# Patient Record
Sex: Male | Born: 1945 | Race: White | Hispanic: No | Smoking: Former smoker
Health system: Southern US, Community
[De-identification: ages and names within clinical notes are randomized; demographics above are authoritative.]

## PROBLEM LIST (undated history)

## (undated) DIAGNOSIS — I4891 Unspecified atrial fibrillation: Secondary | ICD-10-CM

## (undated) DIAGNOSIS — G4730 Sleep apnea, unspecified: Secondary | ICD-10-CM

## (undated) DIAGNOSIS — E119 Type 2 diabetes mellitus without complications: Secondary | ICD-10-CM

## (undated) DIAGNOSIS — J439 Emphysema, unspecified: Secondary | ICD-10-CM

## (undated) DIAGNOSIS — I10 Essential (primary) hypertension: Secondary | ICD-10-CM

## (undated) HISTORY — DX: Sleep apnea, unspecified: G47.30

## (undated) HISTORY — DX: Emphysema, unspecified: J43.9

## (undated) HISTORY — DX: Unspecified atrial fibrillation: I48.91

## (undated) HISTORY — DX: Essential (primary) hypertension: I10

## (undated) HISTORY — DX: Type 2 diabetes mellitus without complications: E11.9

---

## 1978-09-19 HISTORY — PX: NASAL SINUS SURGERY: SHX719

## 1992-09-19 HISTORY — PX: COLON SURGERY: SHX602

## 2010-10-18 LAB — URINALYSIS, ROUTINE W REFLEX MICROSCOPIC
Bilirubin Urine: NEGATIVE
Nitrite: NEGATIVE
Specific Gravity, Urine: 1.019 (ref 1.005–1.030)
Urobilinogen, UA: 1 mg/dL (ref 0.0–1.0)
pH: 5.5 (ref 5.0–8.0)

## 2010-10-18 LAB — CBC
HCT: 50.2 % (ref 39.0–52.0)
MCV: 92.3 fL (ref 78.0–100.0)
Platelets: 188 10*3/uL (ref 150–400)
RBC: 5.44 MIL/uL (ref 4.22–5.81)
RDW: 13.6 % (ref 11.5–15.5)
WBC: 10.2 10*3/uL (ref 4.0–10.5)

## 2010-10-18 LAB — COMPREHENSIVE METABOLIC PANEL
ALT: 18 U/L (ref 0–53)
AST: 19 U/L (ref 0–37)
Alkaline Phosphatase: 58 U/L (ref 39–117)
CO2: 29 mEq/L (ref 19–32)
Chloride: 103 mEq/L (ref 96–112)
GFR calc Af Amer: >60 mL/min (ref >60)
GFR calc non Af Amer: >60 mL/min (ref >60)
Glucose, Bld: 133 mg/dL — ABNORMAL HIGH (ref 70–99)
Sodium: 144 mEq/L (ref 135–145)
Total Bilirubin: 0.7 mg/dL (ref 0.3–1.2)

## 2010-10-18 LAB — URINE MICROSCOPIC-ADD ON

## 2010-10-18 LAB — SURGICAL PCR SCREEN: Staphylococcus aureus: NEGATIVE

## 2010-10-21 ENCOUNTER — Ambulatory Visit (HOSPITAL_COMMUNITY)
Admission: RE | Admit: 2010-10-21 | Discharge: 2010-10-21 | Disposition: A | Discharge status: Home / Self Care | Payer: Worker's Compensation | Source: Ambulatory Visit | Attending: Orthopedic Surgery | Admitting: Orthopedic Surgery

## 2010-10-21 DIAGNOSIS — M659 Unspecified synovitis and tenosynovitis, unspecified site: Secondary | ICD-10-CM | POA: Insufficient documentation

## 2010-10-21 DIAGNOSIS — Z87828 Personal history of other (healed) physical injury and trauma: Secondary | ICD-10-CM | POA: Insufficient documentation

## 2010-10-21 DIAGNOSIS — M23305 Other meniscus derangements, unspecified medial meniscus, unspecified knee: Secondary | ICD-10-CM | POA: Insufficient documentation

## 2010-10-21 DIAGNOSIS — IMO0002 Reserved for concepts with insufficient information to code with codable children: Secondary | ICD-10-CM | POA: Insufficient documentation

## 2010-10-21 DIAGNOSIS — M23302 Other meniscus derangements, unspecified lateral meniscus, unspecified knee: Secondary | ICD-10-CM | POA: Insufficient documentation

## 2010-10-21 DIAGNOSIS — M2240 Chondromalacia patellae, unspecified knee: Secondary | ICD-10-CM | POA: Insufficient documentation

## 2010-10-21 LAB — GLUCOSE, CAPILLARY

## 2010-11-01 NOTE — Op Note (Signed)
NAME:  Timothy Ruiz, Timothy Ruiz NO.:  [id number]  MEDICAL RECORD NO.:  [id number]           PATIENT TYPE:  O  LOCATION:  [facility]                         FACILITY:  [facility]  PHYSICIAN:  Vania Rea. Jaquelyne Firkus, M.D.  DATE OF BIRTH:  Feb 23, 1946  DATE OF PROCEDURE:  10/21/2010 DATE OF DISCHARGE:                              OPERATIVE REPORT   PREOPERATIVE DIAGNOSIS:  Left knee medial meniscus tear.  POSTOPERATIVE DIAGNOSES: 1. Left knee medial meniscus tear. 2. Left knee lateral meniscus tear. 3. Left knee chondromalacia. 4. Left knee synovitis.  PROCEDURES: 1. Left knee diagnostic arthroscopy. 2. Partial medial and partial lateral meniscectomies. 3. Chondroplasty of the patellofemoral joint. 4. Chondroplasty of the medial and lateral femoral condyles. 5. Extensive synovectomy.  SURGEON:  Vania Rea. Akai Dollard, MD.  ASSISTANT:  A. Shuford, PA-C.  ANESTHESIA:  LMA general as well as local.  TOURNIQUET TIME:  None was used.  BLOOD LOSS:  Minimal.  DRAINS:  None.  HISTORY:  Mr. Fischman is a 65 year old gentleman who sustained a work- related left knee injury, has had persistent medial knee pain and mechanical symptoms refractory to attempts at conservative management. Preoperative MRI scan does show evidence for medial meniscal tear as well as chondromalacia.  Due to his ongoing pain and functional limitations, he is brought to the operating room at this time for planned left knee arthroscopy as described below.  Preoperatively, I counseled Mr. Rastetter on treatment options as well as risks versus benefits thereof.  Possible surgical complications were reviewed including potential for bleeding, infection, neurovascular injury, DVT, PE, as well as persistence of pain.  He understands and accepts and agrees with our planned procedure.  PROCEDURE IN DETAIL:  After undergoing routine preop evaluation, the patient received prophylactic antibiotics, and a knee block  anesthetic was placed in the holding area by the Anesthesia Department.  Placed supine on the operating table, underwent smooth induction of LMA general anesthesia.  Left leg placed in a leg holder and sterilely prepped and draped in standard fashion.  Time-out was called.  Standard arthroscopy portals were established and diagnostic arthroscopy was performed.  The suprapatellar pouch and gutters showed diffuse synovitis, particularly in the anterior chamber, encroaching upon the patellofemoral joint. Extensive synovectomy was performed.  Patella showed grade 2 chondromalacia on the lateral patellar facet and there was grade 3 on the medial trochlear groove, and these areas were debrided with a shaver to a stable chondral base.  Normal patellar tracking.  Intercondylar notch of the ACL to be intact.  Medially, there was broad, grade 3 chondromalacia in the medial femoral condyle, which was debrided to a stable chondral base with a shaver.  There was a complex tear involving the posterior half of the medial meniscus and a number of areas of chondrocalcinosis.  A basket was introduced and used to trim the medial meniscus back to a stable peripheral margin.  The shaver was used for final contouring it and removal of the meniscal fragments.  Probing in the remaining peripheral rim showed it to be stable.  An estimated approximately 30% to 40% meniscus was excised.  Laterally, there was a very small  degenerative tear of the mid to lateral meniscus, which was trimmed back with a shaver, and there was some grade 2 chondromalacia on the lateral femoral condyle, which was debrided with shaver.  At this point, final inspection and irrigation were then completed.  Fluid and instruments were removed.  A combination of Marcaine, morphine, and clonidine was instilled in the knee joint, additional Marcaine about the portals.  Portals were closed with Steri-Strips.  A bulky dry dressing taped at the left  knee and was wrapped with Ace bandage, thigh-high support stocking.  The patient was then awakened, extubated, and taken to recovery room in stable condition.     Vania Rea. Lamiya Naas, M.D.     KMS/MEDQ  D:  10/21/2010  T:  10/22/2010  Job:  [id number]  Electronically Signed by Francena Hanly M.D. on 11/01/2010 12:04:44 PM

## 2012-01-20 IMAGING — CR DG CHEST 2V
2 series · 2 of 2 positions shown · non-contrast
Comparison: None.

CLINICAL DATA: Medial meniscus tear in the left knee.  Preoperative
respiratory evaluation.

CHEST - 2 VIEW 06/19/2011:

[view not recorded (1 of 2)]
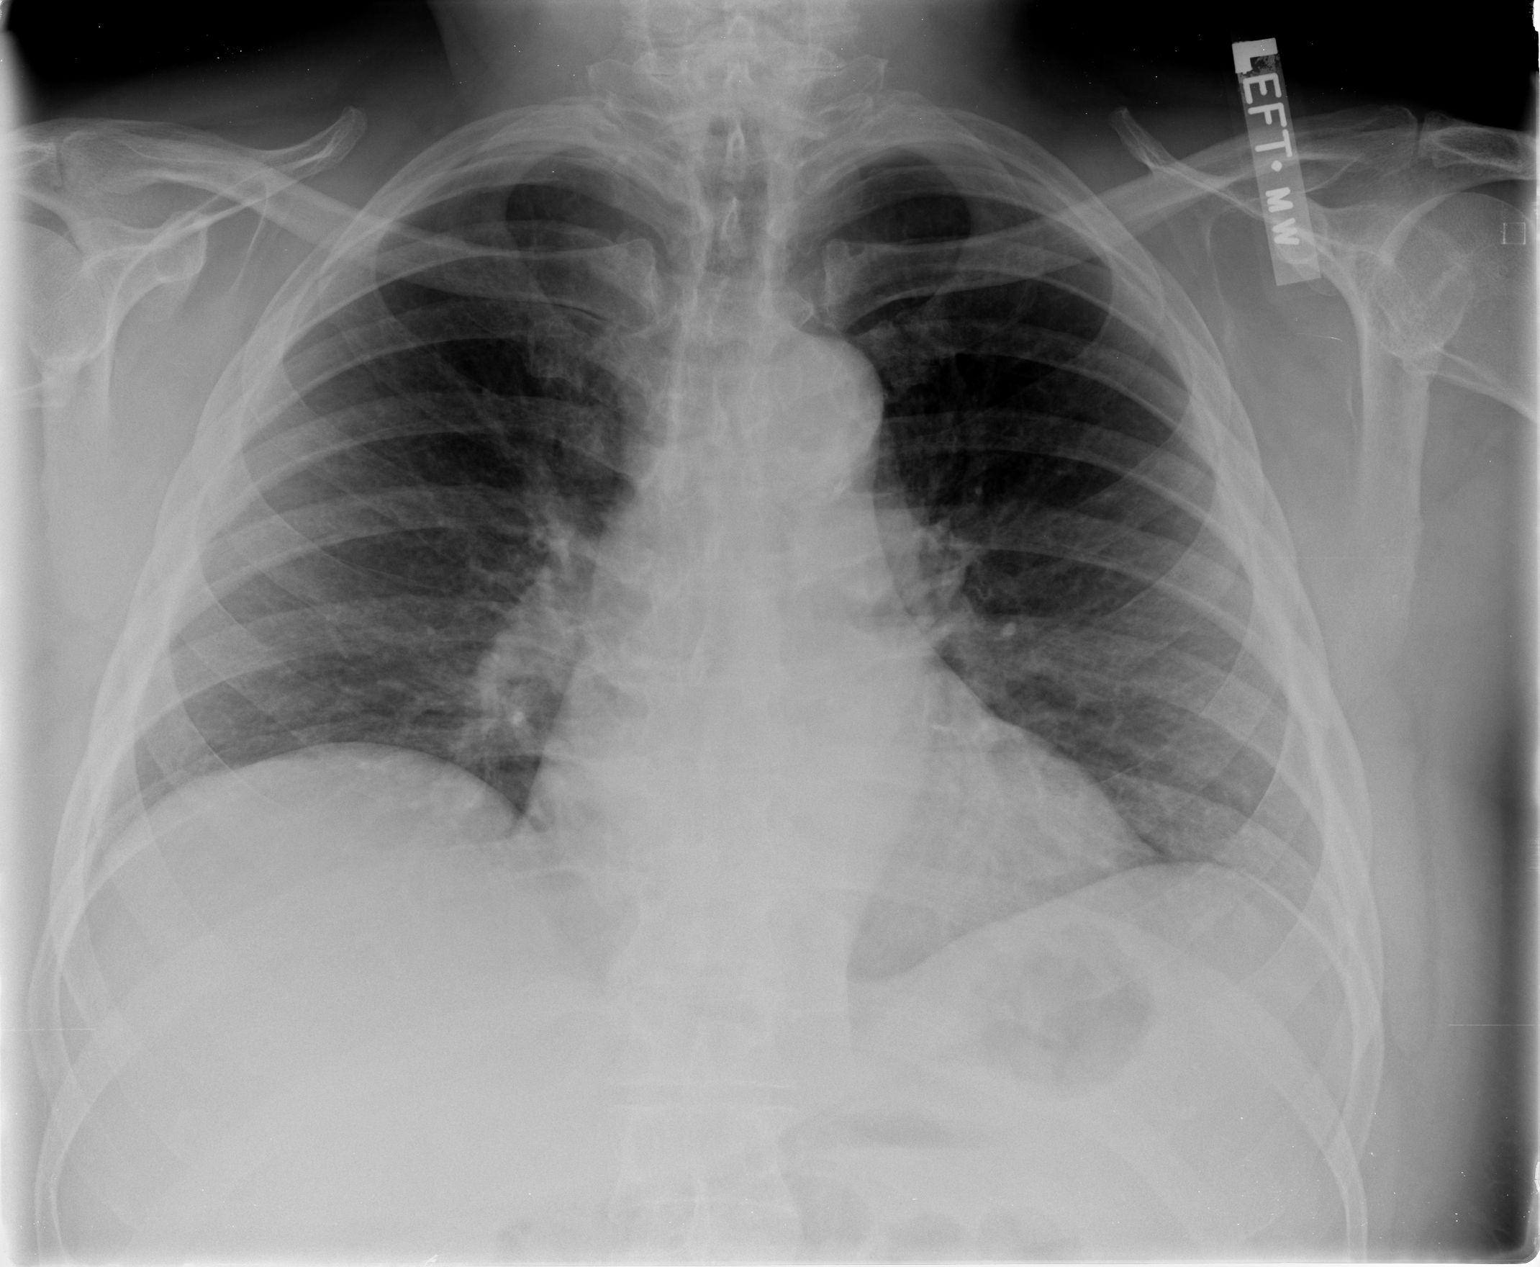

[view not recorded (2 of 2)]
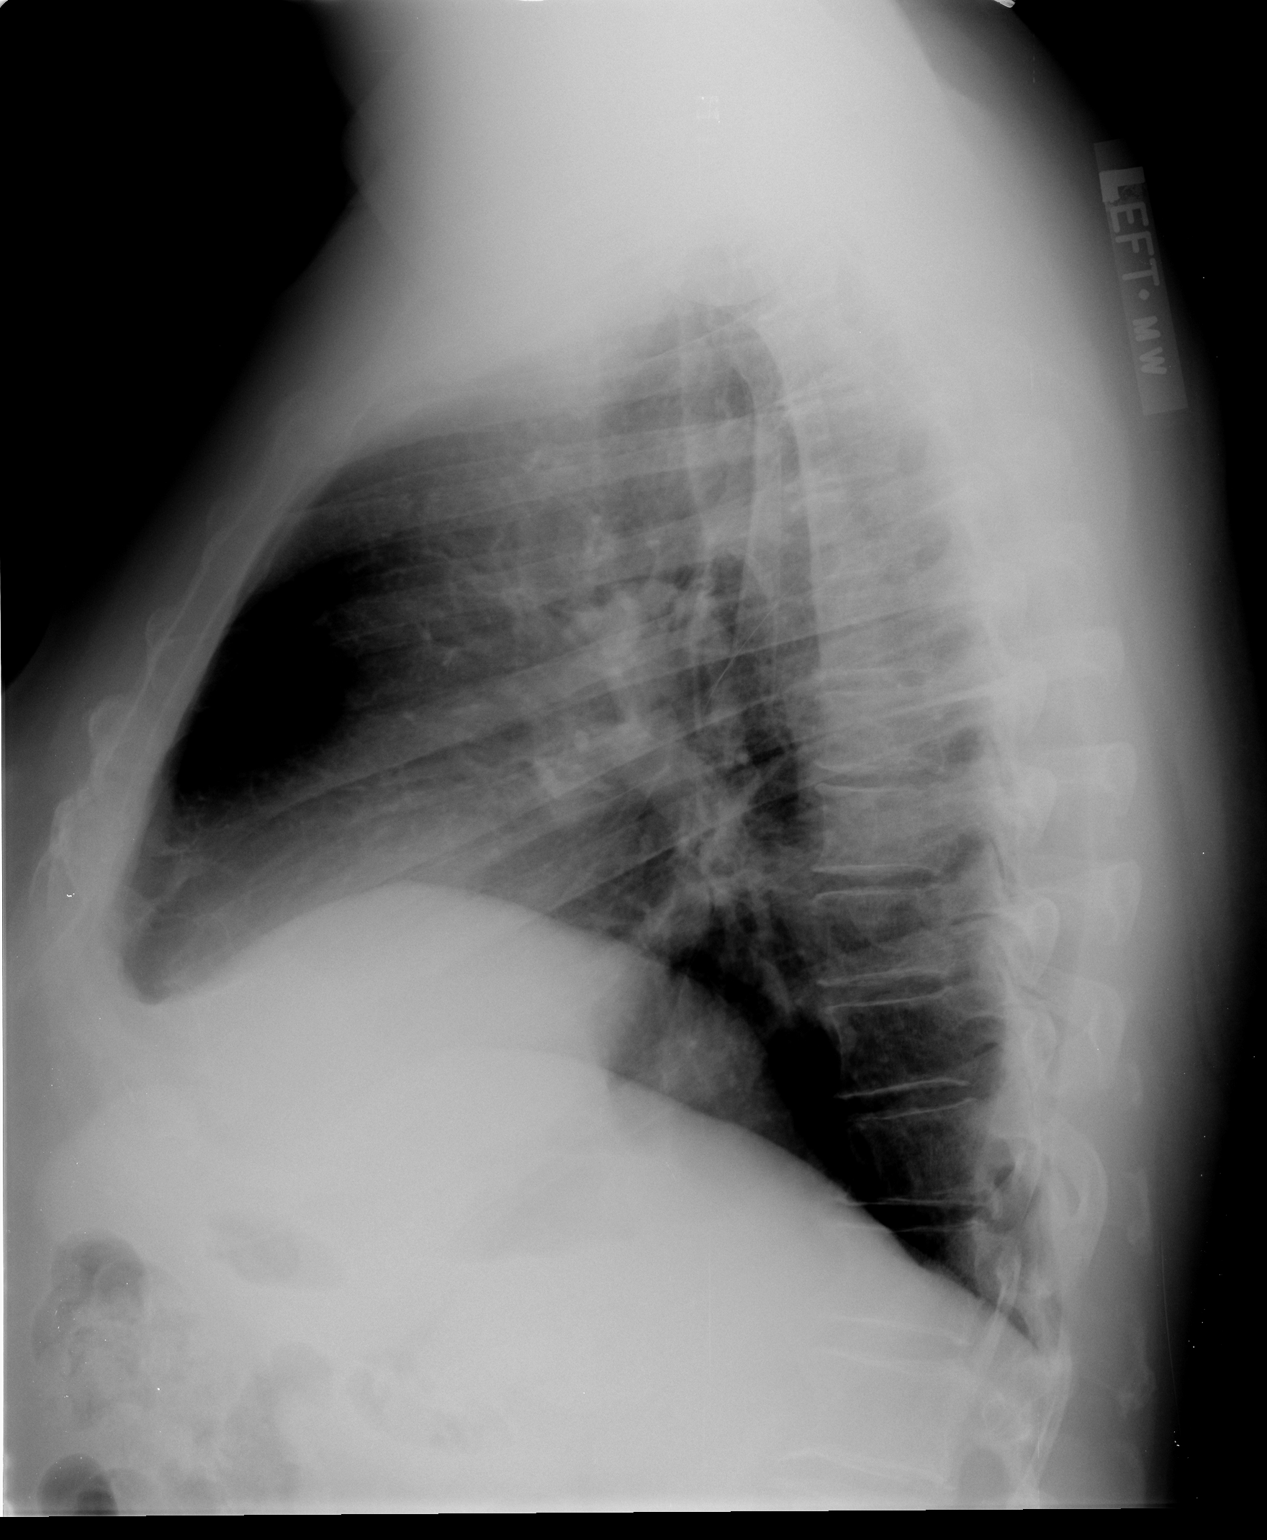

[2 of 2 positions shown; findings below may reference images not displayed]

FINDINGS: Cardiac silhouette upper normal in size.  Thoracic aorta
tortuous and atherosclerotic.  Hilar and mediastinal contours
otherwise unremarkable.  Eventration of the right anterior
hemidiaphragm.  Lungs clear.  Degenerative changes involving the
thoracic spine.
IMPRESSION: No acute cardiopulmonary disease.

## 2014-12-03 LAB — PULMONARY FUNCTION TEST

## 2015-02-24 ENCOUNTER — Ambulatory Visit (INDEPENDENT_AMBULATORY_CARE_PROVIDER_SITE_OTHER): Payer: Self-pay | Admitting: Critical Care Medicine

## 2015-02-24 ENCOUNTER — Encounter: Payer: Self-pay | Admitting: Critical Care Medicine

## 2015-02-24 ENCOUNTER — Telehealth: Payer: Self-pay | Admitting: Critical Care Medicine

## 2015-02-24 VITALS — BP 134/64 | HR 63 | Temp 97.9°F | Ht 68.0 in | Wt 252.4 lb

## 2015-02-24 DIAGNOSIS — I482 Chronic atrial fibrillation, unspecified: Secondary | ICD-10-CM

## 2015-02-24 DIAGNOSIS — I10 Essential (primary) hypertension: Secondary | ICD-10-CM | POA: Insufficient documentation

## 2015-02-24 DIAGNOSIS — J439 Emphysema, unspecified: Secondary | ICD-10-CM | POA: Insufficient documentation

## 2015-02-24 DIAGNOSIS — G4730 Sleep apnea, unspecified: Secondary | ICD-10-CM

## 2015-02-24 DIAGNOSIS — E669 Obesity, unspecified: Secondary | ICD-10-CM

## 2015-02-24 DIAGNOSIS — J432 Centrilobular emphysema: Secondary | ICD-10-CM

## 2015-02-24 DIAGNOSIS — I4891 Unspecified atrial fibrillation: Secondary | ICD-10-CM | POA: Insufficient documentation

## 2015-02-24 DIAGNOSIS — E119 Type 2 diabetes mellitus without complications: Secondary | ICD-10-CM

## 2015-02-24 NOTE — Telephone Encounter (Signed)
[facility]  Had to leave a message on voicemail to obtain records.  Attempted to call back and left message on voicemail.  Will send to Crystal for follow up.

## 2015-02-24 NOTE — Assessment & Plan Note (Addendum)
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

## 2015-02-24 NOTE — Progress Notes (Signed)
Subjective:    Patient ID: Timothy Ruiz, male    DOB: 01-02-1946, 69 y.o.   MRN: [id number]  HPI Comments: Chronic dyspnea x  8 mo ago, pt went to [place] ED and adm 19days. Dx copd with same for 2 yrs.  Ex smoker.   [facility]: adm one month ago: copd [facility].  No pna seen.  [facility] [place]  Shortness of Breath This is a chronic problem. The current episode started more than 1 year ago. The problem occurs daily (dyspnea exertion only , bending over, stairs, incline ). The problem has been waxing and waning. Associated symptoms include leg swelling. Pertinent negatives include no abdominal pain, chest pain, ear pain, fever, headaches, hemoptysis, orthopnea, PND, rash, rhinorrhea, sore throat, sputum production, vomiting or wheezing. The symptoms are aggravated by weather changes, smoke, any activity and exercise. Risk factors include smoking. He has tried beta agonist inhalers for the symptoms. The treatment provided moderate relief. His past medical history is significant for COPD and a heart failure. There is no history of allergies, asthma, bronchiolitis, CAD, chronic lung disease, DVT, PE or pneumonia. (Afib, no CAD., osa: on cpap)    Past Medical History  Diagnosis Date  . Atrial fibrillation   . HTN (hypertension), benign   . Diabetes   . Sleep apnea   . COPD with emphysema      Family History  Problem Relation Age of Onset  . Colon cancer Brother      History   Social History  . Marital Status: Divorced    Spouse Name: N/A  . Number of Children: N/A  . Years of Education: N/A   Occupational History  . Not on file.   Social History Main Topics  . Smoking status: Former Smoker -- 1.00 packs/day for 55 years    Types: Cigarettes    Quit date: 07/26/2014  . Smokeless tobacco: Never Used  . Alcohol Use: No  . Drug Use: No  . Sexual Activity: Not on file   Other Topics Concern  . Not on file   Social History Narrative   Divorced. Lives alone.   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     No Known Allergies   No outpatient prescriptions prior to visit.   No facility-administered medications prior to visit.      Review of Systems  Constitutional: Negative.  Negative for fever.  HENT: Negative.  Negative for ear pain, postnasal drip, rhinorrhea, sinus pressure, sore throat, trouble swallowing and voice change.   Eyes: Negative.   Respiratory: Positive for shortness of breath. Negative for apnea, cough, hemoptysis, sputum production, choking, chest tightness, wheezing and stridor.   Cardiovascular: Positive for leg swelling. Negative for chest pain, palpitations, orthopnea and PND.  Gastrointestinal: Negative.  Negative for nausea, vomiting, abdominal pain and abdominal distention.  Genitourinary: Negative.   Musculoskeletal: Negative.  Negative for myalgias and arthralgias.  Skin: Negative.  Negative for rash.  Allergic/Immunologic: Negative.  Negative for environmental allergies and food allergies.  Neurological: Negative.  Negative for dizziness, syncope, weakness and headaches.  Hematological: Negative.  Negative for adenopathy. Does not bruise/bleed easily.  Psychiatric/Behavioral: Negative.  Negative for sleep disturbance and agitation. The patient is not nervous/anxious.        Objective:   Physical Exam Filed Vitals:   02/24/15 0953  BP: 134/64  Pulse: 63  Temp: 97.9 F (36.6 C)  TempSrc: Oral  Height:  (1.727 m)  Weight: 252 lb  6.4 oz (114.488 kg)  SpO2: 96%    Gen: Pleasant, well-nourished, in no distress,  normal affect  ENT: No lesions,  mouth clear,  oropharynx clear, no postnasal drip  Neck: No JVD, no TMG, no carotid bruits  Lungs: No use of accessory muscles, no dullness to percussion, distant BS  Cardiovascular: RRR, heart sounds normal, no murmur or gallops, no peripheral edema  Abdomen: soft and NT, no HSM,  BS normal  Musculoskeletal: No deformities, no cyanosis or clubbing  Neuro:  alert, non focal  Skin: Warm, no lesions or rashes  No results found.  amb sats RA:  Normal   No x-ray images are available for review in any of our electronic systems No lung function studies are available as well  [facility]: COMPREHENSIVE METABOLIC PANEL - Abnormal  Glucose 147 (*)  Sodium 142  Potassium 3.8  Chloride 103  Carbon Dioxide (CO2) 26  Anion Gap 13  BUN 14  Creatinine 1.14  Calcium 8.7  Alkaline Phosphatase 67  Total Bilirubin 1.02  Total Protein 7.2  Albumin, Serum 4.1  Globulin 3.1  Albumin/Globulin Ratio 1.3  BUN/Creatinine Ratio 12.3  ALT (SGPT) 13  AST 15  GFR-African American >60  Comment: African-American:  Normal GFR (glomerular filtration rate) > 60 mL/min/1.73 meters squared.  < 60 may include impaired kidney function based on creatinine, age, gender,  and race normalized to accepted average body surface area  GFR Non-African American >60  Comment: Non African American:  Normal GFR (glomerular filtration rate) > 60 mL/min/1.73 meters squared.  < 60 may include impaired kidney function based on creatinine, age, gender,  and race normalized to accepted average body surface area.  PROBNP - Abnormal  NT-Probnp Natriuretic Peptide 3360 (*)  Comment: Among patients with dyspnea, NT-proBNPis highly sensitive for the detection of acute congestive heart failure. In addition, a NT-proBNP <300 pg/mL effectively rules out acute congestive heart failure, with 98% negative predictive value.  AUTOMATED DIFFERENTIAL - Abnormal  Neutrophils % 83.7 (*)  Lymphocytes, % 7.7 (*)  Eosinophils % 0.6 (*)  Lymphocytes Absolute 0.7 (*)  Monocytes % 7.5  Basophils % 0.5  Neutrophils Absolute 7.2  Monocytes Absolute 0.6  Eos (Absolute Value) 0.0  Basophils Absolute 0.0  CBC AND DIFFERENTIAL  CK TOTAL AND CKMB  TROPONIN T  Troponin T <0.010  Comment: A Cardiac Troponin T level of 0.010 ng/mL or  greater indicates myocardial damage. Elevated troponin may  also be due to pulmonary emboli, aortic dissection, heart  failure, renal failure, inflammation , trauma, toxins, etc., and ischemia in the setting of critical illness.  CK  CK 128  CK-MB ISOENZYME  CK-MB 1.82  D-DIMER, QUANTITATIVE  D-Dimer, Quant 0.40  Comment: The Roche Tina-quant D-Dimer test is FDA cleared to rule out DVT and PE when used in conjunction with a non-high clinical probability assessment. This FDA clearance was achieved through proven performance in prospective management studies documenting  sensitivities and NPV of 99-100%. Cutoff for exclusion of DVT and PE <0.5 ug/ml (FEU).  ZCBC  WBC 8.6  RBC 4.67  Hemoglobin 14.2  Hematocrit 43.3  MCV 93  MCH 30.4  MCHC 32.8  Platelet Count 197  RDW Standard Deviation 49.8  RDW Coefficient of Variation 15.2  MPV 9.9   Imaging:  XR CHEST AP PORTABLE  Narrative:  TECHNIQUE: AP portable chest, no comparison.  FINDINGS: No infiltrate, mass or pulmonary edema. No pneumothorax or pleural effusion. Borderline heart  size. Elevated right hemidiaphragm.  Impression:  IMPRESSION: No acute disease.   CT chest  Angio 10/2014: no PE     Assessment & Plan:  I personally reviewed all images and lab data in the [facility] system as well as any outside material available during this office visit and agree with the  radiology impressions.   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  Diagnoses and all orders for this  visit:  Centrilobular emphysema Orders: -     AMB REFERRAL FOR DME  Chronic atrial fibrillation  HTN (hypertension), benign  Sleep apnea  Type 2 diabetes mellitus without complication  Obesity    I had an extended discussion with the patient and or family lasting 10 minutes of a 35 minute visit including:  Disease state, treatment options and need for outside records

## 2015-02-24 NOTE — Telephone Encounter (Signed)
I guess it was jennifer that was returning call to elise, said that she had called for records om this pt, I think that he has an appoint today  Give this # to call to get records [phone] opt 2.Caren GriffinsStanley A Dalton

## 2015-02-24 NOTE — Telephone Encounter (Signed)
lmtcb for DIRECTVJennifer.   Marcelino DusterMichelle, please advise if you are aware of what this is in regards to. Thanks.

## 2015-02-24 NOTE — Patient Instructions (Signed)
Stay on symbicort and spiriva Referral to pulmonary rehab will be made Return 3 months

## 2015-02-26 NOTE — Telephone Encounter (Signed)
Called # provided by Victorino Dike.  Spoke with Fleet Contras.  She will request for records to be faxed to triage.  This may take a few business days per Fleet Contras.  Will await records.

## 2015-02-27 NOTE — Telephone Encounter (Signed)
Timothy Ruiz - have you received records?  Please advise.

## 2015-03-03 NOTE — Telephone Encounter (Signed)
Crystal please advise if these need to be re-requested.  Thanks!

## 2015-03-03 NOTE — Telephone Encounter (Signed)
I have not yet received this information.  lmomtcb for Victorino Dike to have records refaxed to triage.

## 2015-03-06 NOTE — Telephone Encounter (Signed)
Noted  

## 2015-03-06 NOTE — Telephone Encounter (Signed)
Received pt's records from [place].  Placed in Dr. Lynelle Doctor folder for review.  Will sign off and route to him as FYI.

## 2015-03-11 ENCOUNTER — Telehealth: Payer: Self-pay

## 2015-03-11 NOTE — Telephone Encounter (Signed)
03/11/15 [facility] Disc Received and sent up to Dr. Delford Field on dumbwaiter. / [facility]

## 2015-03-18 ENCOUNTER — Telehealth: Payer: Self-pay

## 2015-03-18 NOTE — Telephone Encounter (Signed)
03/18/15 [facility] Disc Received on dumbwaiter and filed back on shelf.Jeneen Rinks/[facility]

## 2017-11-17 DEATH — deceased
